# Patient Record
Sex: Male | Born: 2000 | Race: White | Hispanic: No | Marital: Single | Smoking: Never smoker
Health system: Southern US, Community
[De-identification: ages and names within clinical notes are randomized; demographics above are authoritative.]

## PROBLEM LIST (undated history)

## (undated) DIAGNOSIS — G809 Cerebral palsy, unspecified: Secondary | ICD-10-CM

## (undated) DIAGNOSIS — R6250 Unspecified lack of expected normal physiological development in childhood: Secondary | ICD-10-CM

## (undated) DIAGNOSIS — H509 Unspecified strabismus: Secondary | ICD-10-CM

## (undated) DIAGNOSIS — H539 Unspecified visual disturbance: Secondary | ICD-10-CM

## (undated) DIAGNOSIS — R569 Unspecified convulsions: Secondary | ICD-10-CM

## (undated) HISTORY — DX: Unspecified visual disturbance: H53.9

## (undated) HISTORY — PX: EYE SURGERY: SHX253

## (undated) HISTORY — DX: Cerebral palsy, unspecified: G80.9

## (undated) HISTORY — DX: Unspecified strabismus: H50.9

## (undated) HISTORY — PX: EYE MUSCLE SURGERY: SHX370

## (undated) HISTORY — DX: Unspecified lack of expected normal physiological development in childhood: R62.50

## (undated) HISTORY — DX: Unspecified convulsions: R56.9

---
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
## 2020-01-17 ENCOUNTER — Ambulatory Visit: Payer: Medicaid Other | Admitting: Pediatrics

## 2020-02-17 DIAGNOSIS — Z0279 Encounter for issue of other medical certificate: Secondary | ICD-10-CM

## 2020-06-12 ENCOUNTER — Telehealth: Payer: Self-pay | Admitting: Pediatrics

## 2020-06-12 NOTE — Telephone Encounter (Signed)
Medication forms on your desk to fill out please 

## 2020-06-15 NOTE — Telephone Encounter (Signed)
Medication form filled  

## 2020-06-28 ENCOUNTER — Other Ambulatory Visit: Payer: Self-pay | Admitting: Pediatrics

## 2020-08-30 ENCOUNTER — Other Ambulatory Visit: Payer: Self-pay | Admitting: Pediatrics

## 2020-10-03 ENCOUNTER — Telehealth: Payer: Self-pay

## 2020-10-03 NOTE — Telephone Encounter (Signed)
School form dropped off  Placed on desk

## 2020-10-09 ENCOUNTER — Telehealth: Payer: Self-pay

## 2020-10-09 NOTE — Telephone Encounter (Signed)
Called and left message to call back to make a [facility] suggested date was 11/23/20 at 10:45am as of 10/09/2020.  

## 2020-10-09 NOTE — Telephone Encounter (Signed)
Needs well visit--last WCC 12 months ago.

## 2020-10-09 NOTE — Telephone Encounter (Signed)
Called and left message to call back to make a [facility] suggested date was 11/23/20 at 10:45am as of 10/09/2020.

## 2020-10-10 ENCOUNTER — Telehealth: Payer: Self-pay

## 2020-10-10 NOTE — Telephone Encounter (Signed)
Open an error.

## 2020-10-16 ENCOUNTER — Ambulatory Visit: Payer: Medicaid Other | Admitting: Pediatrics

## 2020-10-17 ENCOUNTER — Telehealth: Payer: Self-pay

## 2020-10-17 NOTE — Telephone Encounter (Signed)
Called and asked to reschedule, missed last appointment she said they just had a big miscommunication. She was suppose to remind the person with [facility] that he had an appointment and so the time passes and as she realized and tried to gather the stuff she realized she could not make it.

## 2020-10-18 ENCOUNTER — Other Ambulatory Visit: Payer: Self-pay

## 2020-10-18 ENCOUNTER — Ambulatory Visit (INDEPENDENT_AMBULATORY_CARE_PROVIDER_SITE_OTHER): Payer: Medicaid Other | Admitting: Pediatrics

## 2020-10-18 VITALS — BP 118/70 | Ht 67.0 in | Wt 99.1 lb

## 2020-10-18 DIAGNOSIS — Z0000 Encounter for general adult medical examination without abnormal findings: Secondary | ICD-10-CM

## 2020-10-18 DIAGNOSIS — Q079 Congenital malformation of nervous system, unspecified: Secondary | ICD-10-CM

## 2020-10-18 DIAGNOSIS — Z0001 Encounter for general adult medical examination with abnormal findings: Secondary | ICD-10-CM | POA: Diagnosis not present

## 2020-10-18 DIAGNOSIS — G801 Spastic diplegic cerebral palsy: Secondary | ICD-10-CM

## 2020-10-18 DIAGNOSIS — G9349 Other encephalopathy: Secondary | ICD-10-CM

## 2020-10-18 DIAGNOSIS — Z68.41 Body mass index (BMI) pediatric, less than 5th percentile for age: Secondary | ICD-10-CM

## 2020-10-18 DIAGNOSIS — R6250 Unspecified lack of expected normal physiological development in childhood: Secondary | ICD-10-CM

## 2020-10-18 NOTE — Progress Notes (Signed)
Adolescent Well Care Visit Eric Irwin is a 19 y.o. male who is here for his annual check.    PCP:  Georgiann Hahn, MD   History was provided by the adoptive mother.  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 Cortical impairment  No neurology follow up anymore  Equipment Weighted spoon/fork--mom buys ---was not approved by insurance Mom makes bibs from towels  Speech--in speech therapy  PT/OT--trying to get him in for additional PT/OT at school   Nutrition: Good eater-chopped food--not minced--placed into 1/2 inch bite size. Full liquids.   Sleep:  Sleep: good  Social Screening: Lives with:  guardian Parental relations:  good---trip planned for vacation Activities, Work, and Chores: n/a Concerns regarding behavior with peers?  no Stressors of note: medical issues  Education: special ed   Tobacco?  no Secondhand smoke exposure?  no Drugs/ETOH?  no  Sexually Active?  no   Pregnancy Prevention: n/a  Safe at home, in school & in relationships?  Yes Safe to self?  Yes   Screenings: Patient has a dental home: yes  The patient completed the Rapid Assessment for Adolescent Preventive Services screening questionnaire and the following topics were identified as risk factors and discussed: speech/OT/PT ---special needs   PHQ-9 ---not applicable  Physical Exam:  Vitals:   10/18/20 1112  BP: 118/70  Weight: 99 lb 0.9 oz (44.9 kg)  Height: 5\' 7"  (1.702 m)   BP 118/70   Ht 5\' 7"  (1.702 m)   Wt 99  lb 0.9 oz (44.9 kg)   BMI 15.51 kg/m  Body mass index: body mass index is 15.51 kg/m. Blood pressure percentiles are not available for patients who are 18 years or older.    General Appearance:   well nourished  HENT: Normocephalic, no obvious abnormality, conjunctiva clear  Mouth:   Normal appearing teeth, no obvious discoloration, dental caries, or dental caps  Neck:   Supple; thyroid: no enlargement, symmetric, no tenderness/mass/nodules     Lungs:   Clear to auscultation bilaterally, normal work of breathing  Heart:   Regular rate and rhythm, S1 and S2 normal, no murmurs;   Abdomen:   Soft, non-tender, no mass, or organomegaly  GU normal male genitals, no testicular masses or hernia  Musculoskeletal:   Tone and strength strong and symmetrical, all extremities               Lymphatic:   No cervical adenopathy  Skin/Hair/Nails:   Skin warm, dry and intact, no rashes, no bruises or petechiae  Neurologic:   Strength, gait----normal     Assessment and Plan:   Stable-Spastic CP/ Dev Delay/ Static encephalopathy  BMI is appropriate for age  Vaccines reviewed and up to date  Follow up in 6 months  , MD

## 2020-10-19 ENCOUNTER — Encounter: Payer: Self-pay | Admitting: Pediatrics

## 2020-10-19 DIAGNOSIS — Z0000 Encounter for general adult medical examination without abnormal findings: Secondary | ICD-10-CM | POA: Insufficient documentation

## 2020-10-19 DIAGNOSIS — Q079 Congenital malformation of nervous system, unspecified: Secondary | ICD-10-CM

## 2020-10-19 HISTORY — DX: Congenital malformation of nervous system, unspecified: Q07.9

## 2020-10-19 NOTE — Patient Instructions (Signed)
Dysphagia Eating Plan, Bite Size Food This diet plan is for people with moderate swallowing problems who have transitioned from pureed and minced foods. Bite size foods are soft and cut into small chunks so that they can be swallowed safely. On this eating plan, you may be instructed to drink liquids that are thickened. Work with your health care provider and your diet and nutrition specialist (dietitian) to make sure that you are following the diet safely and getting all the nutrients you need. What are tips for following this plan? General guidelines for foods   You may eat foods that are tender, soft, and moist.  Always test food texture before taking a bite. Poke food with a fork or spoon to make sure it is tender.  Food should be easy to cut and shew. Avoid large pieces of food that require a lot of chewing.  Take small bites. Each bite should be smaller than your thumb nail (about 15mm by 15 mm).  If you were on pureed and minced food diet plans, you may eat any of the foods included in those diets.  Avoid foods that are very dry, hard, sticky, chewy, coarse, or crunchy.  If instructed by your health care provider, thicken liquids. Follow your health care provider's instructions for what products to use, how to do this, and to what thickness. ? Your health care provider may recommend using a commercial thickener, rice cereal, or potato flakes. Ask your health care provider to recommend thickeners. ? Thickened liquids are usually a "pudding-like" consistency, or they may be as thick as honey or thick enough to eat with a spoon. Cooking  To moisten foods, you may add liquids while you are blending, mashing, or grinding your foods to the right consistency. These liquids include gravies, sauces, vegetable or fruit juice, milk, half and half, or water.  Strain extra liquid from foods before eating.  Reheat foods slowly to prevent a tough crust from forming.  Prepare foods in advance.  Meal planning  Eat a variety of foods to get all the nutrients you need.  Some foods may be tolerated better than others. Work with your dietitian to identify which foods are safest for you to eat.  Follow your meal plan as told by your dietitian. What foods are allowed? Grains Moist breads without nuts or seeds. Biscuits, muffins, pancakes, and waffles that are well-moistened with syrup, jelly, margarine, or butter. Cooked cereals. Moist bread stuffing. Moist rice. Well-moistened cold cereal with small chunks. Well-cooked pasta, noodles, rice, and bread dressing in small pieces and thick sauce. Soft dumplings or spaetzle in small pieces and butter or gravy. Vegetables Soft, well-cooked vegetables in small pieces. Soft-cooked, mashed potatoes. Thickened vegetable juice. Fruits Canned or cooked fruits that are soft or moist and do not have skin or seeds. Fresh, soft bananas. Thickened fruit juices. Meat and other protein foods Tender, moist meats or poultry in small pieces. Moist meatballs or meatloaf. Fish without bones. Eggs or egg substitutes in small pieces. Tofu. Tempeh and meat alternatives in small pieces. Well-cooked, tender beans, peas, baked beans, and other legumes. Dairy Thickened milk. Cream cheese. Yogurt. Cottage cheese. Sour cream. Small pieces of soft cheese. Fats and oils Butter. Oils. Margarine. Mayonnaise. Gravy. Spreads. Sweets and desserts Soft, smooth, moist desserts. Pudding. Custard. Moist cakes. Jam. Jelly. Honey. Preserves. Ask your health care provider whether you can have frozen desserts. Seasoning and other foods All seasonings and sweeteners. All sauces with small chunks. Prepared tuna, egg, or chicken   salad without raw fruits or vegetables. Moist casseroles with small, tender pieces of meat. Soups with tender meat. What foods are not allowed? Grains Coarse or dry cereals. Dry breads. Toast. Crackers. Tough, crusty breads, such as French bread and baguettes.  Dry pancakes, waffles, and muffins. Sticky rice. Dry bread stuffing. Granola. Popcorn. Chips. Vegetables All raw vegetables. Cooked corn. Rubbery or stiff cooked vegetables. Stringy vegetables, such as celery. Tough, crisp fried potatoes. Potato skins. Fruits Hard, crunchy, stringy, high-pulp, and juicy raw fruits such as apples, pineapple, papaya, and watermelon. Small, round fruits, such as grapes. Dried fruit and fruit leather. Meat and other protein foods Large pieces of meat. Dry, tough meats, such as bacon, sausage, and hot dogs. Chicken, turkey, or fish with skin and bones. Crunchy peanut butter. Nuts. Seeds. Nut and seed butters. Dairy Yogurt with nuts, seeds, or large chunks. Large chunks of cheese. Frozen desserts and milk consistency not allowed by your dietitian. Sweets and desserts Dry cakes. Chewy or dry cookies. Any desserts with nuts, seeds, dry fruits, coconut, pineapple, or anything dry, sticky, or hard. Chewy caramel. Licorice. Taffy-type candies. Ask your health care provider whether you can have frozen desserts. Seasoning and other foods Soups with tough or large chunks of meats, poultry, or vegetables. Corn or clam chowder. Smoothies with large chunks of fruit. Summary  Bite size foods can be helpful for people with moderate swallowing problems.  On the dysphagia eating plan, you may eat foods that are soft, moist, and cut into pieces smaller than 15mm by 15mm.  You may be instructed to thicken liquids. Follow your health care provider's instructions about how to do this and to what consistency. This information is not intended to replace advice given to you by your health care provider. Make sure you discuss any questions you have with your health care provider. Document Revised: 02/04/2019 Document Reviewed: 01/24/2017 Elsevier Patient Education  2020 [employer] Inc.  

## 2020-11-13 ENCOUNTER — Ambulatory Visit: Payer: Medicaid Other | Admitting: Pediatrics

## 2020-12-09 ENCOUNTER — Other Ambulatory Visit: Payer: Self-pay | Admitting: Pediatrics

## 2021-04-02 ENCOUNTER — Encounter (HOSPITAL_BASED_OUTPATIENT_CLINIC_OR_DEPARTMENT_OTHER): Payer: Self-pay | Admitting: Pediatric Dentistry

## 2021-04-02 ENCOUNTER — Other Ambulatory Visit: Payer: Self-pay

## 2021-04-02 NOTE — Progress Notes (Signed)
Reviewed pt's pmh with Dr Malen Gauze. Ok to proceed with surgery at [facility].

## 2021-04-03 ENCOUNTER — Ambulatory Visit (INDEPENDENT_AMBULATORY_CARE_PROVIDER_SITE_OTHER): Payer: Medicaid Other | Admitting: Pediatrics

## 2021-04-03 VITALS — BP 112/80 | HR 121 | Ht 65.5 in | Wt 95.3 lb

## 2021-04-03 DIAGNOSIS — Z01818 Encounter for other preprocedural examination: Secondary | ICD-10-CM

## 2021-04-03 DIAGNOSIS — G9349 Other encephalopathy: Secondary | ICD-10-CM | POA: Diagnosis not present

## 2021-04-03 DIAGNOSIS — G801 Spastic diplegic cerebral palsy: Secondary | ICD-10-CM | POA: Diagnosis not present

## 2021-04-03 DIAGNOSIS — Q079 Congenital malformation of nervous system, unspecified: Secondary | ICD-10-CM | POA: Diagnosis not present

## 2021-04-04 ENCOUNTER — Encounter: Payer: Self-pay | Admitting: Pediatrics

## 2021-04-04 DIAGNOSIS — Z01818 Encounter for other preprocedural examination: Secondary | ICD-10-CM | POA: Insufficient documentation

## 2021-04-04 NOTE — Patient Instructions (Signed)
General Anesthesia, Adult General anesthesia is the use of medicines to make a person "go to sleep" (unconscious) for a medical procedure. General anesthesia must be used for certain procedures, and is often recommended for procedures that:  Last a long time.  Require you to be still or in an unusual position.  Are major and can cause blood loss. The medicines used for general anesthesia are called general anesthetics. As well as making you unconscious for a certain amount of time, these medicines:  Prevent pain.  Control your blood pressure.  Relax your muscles. Tell a health care provider about:  Any allergies you have.  All medicines you are taking, including vitamins, herbs, eye drops, creams, and over-the-counter medicines.  Any problems you or family members have had with anesthetic medicines.  Types of anesthetics you have had in the past.  Any blood disorders you have.  Any surgeries you have had.  Any medical conditions you have.  Any recent upper respiratory, chest, or ear infections.  Any history of: ? Heart or lung conditions, such as heart failure, sleep apnea, asthma, or chronic obstructive pulmonary disease (COPD). ? Military service. ? Depression or anxiety.  Any tobacco or drug use, including marijuana or alcohol use.  Whether you are pregnant or may be pregnant. What are the risks? Generally, this is a safe procedure. However, problems may occur, including:  Allergic reaction.  Lung and heart problems.  Inhaling food or liquid from the stomach into the lungs (aspiration).  Nerve injury.  Dental injury.  Air in the bloodstream, which can lead to stroke.  Extreme agitation or confusion (delirium) when you wake up from the anesthetic.  Waking up during your procedure and being unable to move. This is rare. These problems are more likely to develop if you are having a major surgery or if you have an advanced or serious medical condition. You  can prevent some of these complications by answering all of your health care provider's questions thoroughly and by following all instructions before your procedure. General anesthesia can cause side effects, including:  Nausea or vomiting.  A sore throat from the breathing tube.  Hoarseness.  Wheezing or coughing.  Shaking chills.  Tiredness.  Body aches.  Anxiety.  Sleepiness or drowsiness.  Confusion or agitation. What happens before the procedure? Staying hydrated Follow instructions from your health care provider about hydration, which may include:  Up to 2 hours before the procedure - you may continue to drink clear liquids, such as water, clear fruit juice, black coffee, and plain tea.   Eating and drinking restrictions Follow instructions from your health care provider about eating and drinking, which may include:  8 hours before the procedure - stop eating heavy meals or foods such as meat, fried foods, or fatty foods.  6 hours before the procedure - stop eating light meals or foods, such as toast or cereal.  6 hours before the procedure - stop drinking milk or drinks that contain milk.  2 hours before the procedure - stop drinking clear liquids. Medicines Ask your health care provider about:  Changing or stopping your regular medicines. This is especially important if you are taking diabetes medicines or blood thinners.  Taking medicines such as aspirin and ibuprofen. These medicines can thin your blood. Do not take these medicines unless your health care provider tells you to take them.  Taking over-the-counter medicines, vitamins, herbs, and supplements. Do not take these during the week before your procedure unless your health   care provider approves them. General instructions  Starting 3-6 weeks before the procedure, do not use any products that contain nicotine or tobacco, such as cigarettes and e-cigarettes. If you need help quitting, ask your health care  provider.  If you brush your teeth on the morning of the procedure, make sure to spit out all of the toothpaste.  Tell your health care provider if you become ill or develop a cold, cough, or fever.  If instructed by your health care provider, bring your sleep apnea device with you on the day of your surgery (if applicable).  Ask your health care provider if you will be going home the same day, the following day, or after a longer hospital stay. ? Plan to have a responsible adult take you home from the hospital or clinic. ? Plan to have a responsible adult care for you for the time you are told after you leave the hospital or clinic. This is important. What happens during the procedure?  You will be given anesthetics through both of the following: ? A mask placed over your nose and mouth. ? An IV in one of your veins.  You may receive a medicine to help you relax (sedative).  After you are unconscious, a breathing tube may be inserted down your throat to help you breathe. This will be removed before you wake up.  An anesthesia specialist will stay with you throughout your procedure. He or she will: ? Keep you comfortable and safe by continuing to give you medicines and adjusting the amount of medicine that you get. ? Monitor your blood pressure, pulse, and oxygen levels to make sure that the anesthetics do not cause any problems. The procedure may vary among health care providers and hospitals.   What happens after the procedure?  Your blood pressure, temperature, heart rate, breathing rate, and blood oxygen level will be monitored until the medicines you were given have worn off.  You will wake up in a recovery area. You may wake up slowly.  If you feel anxious or agitated, you may be given medicine to help you calm down.  If you will be going home the same day, your health care provider may check to make sure you can walk, drink, and urinate.  Your health care provider will treat  any pain or side effects you have before you go home.  Do not drive or operate machinery until your health care provider says that it is safe. Summary  General anesthesia is used to keep you still and prevent pain during a procedure.  It is important to tell your health care provider about your medical history and any surgeries you have had, and previous experience with anesthesia.  Follow your health care provider's instructions about when to stop eating, drinking, or taking certain medicines before your procedure.  Plan to have a responsible adult take you home from the hospital or clinic. This information is not intended to replace advice given to you by your health care provider. Make sure you discuss any questions you have with your health care provider. Document Revised: 06/26/2020 Document Reviewed: 01/26/2020 Elsevier Patient Education  2021 Elsevier Inc.  

## 2021-04-04 NOTE — Progress Notes (Signed)
Subjective:    Eric Irwin is a 20 y.o. male with STATIC ENCEPHALOPATHY AND DEVELOPMENTAL DELAY who presents to the office today for a preoperative consultation at the request of surgeon --dentist who plans on performing full mouth rehab on June 18. This consultation is requested for the specific conditions prompting preoperative evaluation (i.e. because of potential affect on operative risk): Routine . Planned anesthesia: general. The patient has the following known anesthesia issues: none. Patients bleeding risk: no recent abnormal bleeding. Patient does not have objections to receiving blood products if needed.  The following portions of the patient's history were reviewed and updated as appropriate: allergies, current medications, past family history, past medical history, past social history, past surgical history and problem list.  Review of Systems Pertinent items are noted in HPI.    Objective:    BP 112/80   Pulse (!) 121   Ht 5' 5.5" (1.664 m)   Wt 95 lb 4.8 oz (43.2 kg)   SpO2 95%   BMI 15.62 kg/m  General appearance: alert, cooperative and no distress Head: Normocephalic, without obvious abnormality Eyes: negative Ears: normal TM's and external ear canals both ears Nose: Nares normal. Septum midline. Mucosa normal. No drainage or sinus tenderness. Throat: abnormal findings: gingivitis and multiple caries Neck: no adenopathy and supple, symmetrical, trachea midline Lungs: clear to auscultation bilaterally Heart: regular rate and rhythm, S1, S2 normal, no murmur, click, rub or gallop Abdomen: soft, non-tender; bowel sounds normal; no masses,  no organomegaly   Skin: normal Neurologic: baseline mental status---non communicative.  Predictors of intubation difficulty: No anticipated risk for anesthesia   Assessment:      20 y.o. male with planned surgery as above.   Known risk factors for perioperative complications: None   Difficulty with intubation is not  anticipated.  Cardiac Risk Estimation: none    Plan:    1. Preoperative exam normal 2. Cleared for surgery under GA 3. Follow as needed

## 2021-04-10 ENCOUNTER — Encounter (HOSPITAL_BASED_OUTPATIENT_CLINIC_OR_DEPARTMENT_OTHER)
Admission: RE | Disposition: A | Discharge status: Home / Self Care | Payer: Self-pay | Source: Home / Self Care | Attending: Pediatric Dentistry

## 2021-04-10 ENCOUNTER — Ambulatory Visit (HOSPITAL_BASED_OUTPATIENT_CLINIC_OR_DEPARTMENT_OTHER): Payer: Medicaid Other | Admitting: Certified Registered"

## 2021-04-10 ENCOUNTER — Encounter (HOSPITAL_BASED_OUTPATIENT_CLINIC_OR_DEPARTMENT_OTHER): Payer: Self-pay | Admitting: Pediatric Dentistry

## 2021-04-10 ENCOUNTER — Other Ambulatory Visit: Payer: Self-pay

## 2021-04-10 ENCOUNTER — Ambulatory Visit (HOSPITAL_BASED_OUTPATIENT_CLINIC_OR_DEPARTMENT_OTHER)
Admission: RE | Admit: 2021-04-10 | Discharge: 2021-04-10 | Disposition: A | Discharge status: Home / Self Care | Payer: Medicaid Other | Attending: Pediatric Dentistry | Admitting: Pediatric Dentistry

## 2021-04-10 DIAGNOSIS — K029 Dental caries, unspecified: Secondary | ICD-10-CM | POA: Insufficient documentation

## 2021-04-10 DIAGNOSIS — F88 Other disorders of psychological development: Secondary | ICD-10-CM | POA: Diagnosis not present

## 2021-04-10 HISTORY — PX: DENTAL RESTORATION/EXTRACTION WITH X-RAY: SHX5796

## 2021-04-10 SURGERY — DENTAL RESTORATION/EXTRACTION WITH X-RAY
Anesthesia: General | Site: Mouth

## 2021-04-10 MED ORDER — ACETAMINOPHEN 10 MG/ML IV SOLN: 1000.0000 mg | Freq: Once | INTRAVENOUS | Status: DC | PRN

## 2021-04-10 MED ORDER — OXYMETAZOLINE HCL 0.05 % NA SOLN
NASAL | Status: DC | PRN
  Administered 2021-04-10: 2 via NASAL

## 2021-04-10 MED ORDER — SUGAMMADEX SODIUM 200 MG/2ML IV SOLN
INTRAVENOUS | Status: DC | PRN
  Administered 2021-04-10: 100 mg via INTRAVENOUS

## 2021-04-10 MED ORDER — DEXAMETHASONE SODIUM PHOSPHATE 10 MG/ML IJ SOLN
INTRAMUSCULAR | Status: DC | PRN
  Administered 2021-04-10: 6 mg via INTRAVENOUS

## 2021-04-10 MED ORDER — MIDAZOLAM HCL 2 MG/2ML IJ SOLN
INTRAMUSCULAR | Status: AC
  Filled 2021-04-10: qty 2

## 2021-04-10 MED ORDER — DEXAMETHASONE SODIUM PHOSPHATE 10 MG/ML IJ SOLN
INTRAMUSCULAR | Status: AC
  Filled 2021-04-10: qty 1

## 2021-04-10 MED ORDER — LIDOCAINE-EPINEPHRINE 2 %-1:100000 IJ SOLN
INTRAMUSCULAR | Status: DC | PRN
  Administered 2021-04-10: 1 mL

## 2021-04-10 MED ORDER — PROMETHAZINE HCL 25 MG/ML IJ SOLN: 6.2500 mg | INTRAMUSCULAR | Status: DC | PRN

## 2021-04-10 MED ORDER — LIDOCAINE HCL (CARDIAC) PF 100 MG/5ML IV SOSY
PREFILLED_SYRINGE | INTRAVENOUS | Status: DC | PRN
  Administered 2021-04-10: 60 mg via INTRAVENOUS

## 2021-04-10 MED ORDER — MIDAZOLAM HCL 2 MG/ML PO SYRP
15.0000 mg | ORAL_SOLUTION | Freq: Once | ORAL | Status: AC
  Administered 2021-04-10: 15 mg via ORAL

## 2021-04-10 MED ORDER — ROCURONIUM 10MG/ML (10ML) SYRINGE FOR MEDFUSION PUMP - OPTIME: INTRAVENOUS | Status: DC | PRN

## 2021-04-10 MED ORDER — KETOROLAC TROMETHAMINE 30 MG/ML IJ SOLN
INTRAMUSCULAR | Status: DC | PRN
  Administered 2021-04-10: 15 mg via INTRAVENOUS

## 2021-04-10 MED ORDER — EPHEDRINE 5 MG/ML INJ
INTRAVENOUS | Status: AC
  Filled 2021-04-10: qty 30

## 2021-04-10 MED ORDER — FENTANYL CITRATE (PF) 100 MCG/2ML IJ SOLN
INTRAMUSCULAR | Status: AC
  Filled 2021-04-10: qty 2

## 2021-04-10 MED ORDER — ONDANSETRON HCL 4 MG/2ML IJ SOLN
INTRAMUSCULAR | Status: AC
  Filled 2021-04-10: qty 2

## 2021-04-10 MED ORDER — ONDANSETRON HCL 4 MG/2ML IJ SOLN
INTRAMUSCULAR | Status: DC | PRN
  Administered 2021-04-10: 4 mg via INTRAVENOUS

## 2021-04-10 MED ORDER — ROCURONIUM BROMIDE 100 MG/10ML IV SOLN
INTRAVENOUS | Status: DC | PRN
  Administered 2021-04-10: 40 mg via INTRAVENOUS

## 2021-04-10 MED ORDER — FENTANYL CITRATE (PF) 100 MCG/2ML IJ SOLN
INTRAMUSCULAR | Status: DC | PRN
  Administered 2021-04-10: 100 ug via INTRAVENOUS

## 2021-04-10 MED ORDER — FENTANYL CITRATE (PF) 100 MCG/2ML IJ SOLN: 25.0000 ug | INTRAMUSCULAR | Status: DC | PRN

## 2021-04-10 MED ORDER — KETOROLAC TROMETHAMINE 30 MG/ML IJ SOLN
INTRAMUSCULAR | Status: AC
  Filled 2021-04-10: qty 1

## 2021-04-10 MED ORDER — PROPOFOL 10 MG/ML IV BOLUS
INTRAVENOUS | Status: DC | PRN
  Administered 2021-04-10: 120 mg via INTRAVENOUS

## 2021-04-10 MED ORDER — MIDAZOLAM HCL 2 MG/ML PO SYRP
ORAL_SOLUTION | ORAL | Status: AC
  Filled 2021-04-10: qty 10

## 2021-04-10 MED ORDER — LACTATED RINGERS IV SOLN: INTRAVENOUS | Status: DC

## 2021-04-10 MED ORDER — DEXMEDETOMIDINE (PRECEDEX) IN NS 20 MCG/5ML (4 MCG/ML) IV SYRINGE
PREFILLED_SYRINGE | INTRAVENOUS | Status: AC
  Filled 2021-04-10: qty 5

## 2021-04-10 MED ORDER — DEXMEDETOMIDINE (PRECEDEX) IN NS 20 MCG/5ML (4 MCG/ML) IV SYRINGE
PREFILLED_SYRINGE | INTRAVENOUS | Status: DC | PRN
  Administered 2021-04-10 (×2): 4 ug via INTRAVENOUS

## 2021-04-10 MED ORDER — PROPOFOL 10 MG/ML IV BOLUS
INTRAVENOUS | Status: AC
  Filled 2021-04-10: qty 20

## 2021-04-10 MED ORDER — ROCURONIUM BROMIDE 10 MG/ML (PF) SYRINGE
PREFILLED_SYRINGE | INTRAVENOUS | Status: AC
  Filled 2021-04-10: qty 10

## 2021-04-10 MED ORDER — BUPIVACAINE HCL (PF) 0.25 % IJ SOLN
INTRAMUSCULAR | Status: AC
  Filled 2021-04-10: qty 210

## 2021-04-10 SURGICAL SUPPLY — 18 items
BLADE SURG 15 STRL LF DISP TIS (BLADE) ×1 IMPLANT
BLADE SURG 15 STRL SS (BLADE) ×3
BNDG COHESIVE 2X5 TAN STRL LF (GAUZE/BANDAGES/DRESSINGS) IMPLANT
BNDG CONFORM 2 STRL LF (GAUZE/BANDAGES/DRESSINGS) ×3 IMPLANT
BNDG EYE OVAL (GAUZE/BANDAGES/DRESSINGS) ×6 IMPLANT
COVER MAYO STAND STRL (DRAPES) ×3 IMPLANT
COVER SURGICAL LIGHT HANDLE (MISCELLANEOUS) ×3 IMPLANT
MANIFOLD NEPTUNE II (INSTRUMENTS) ×3 IMPLANT
SLEEVE SCD COMPRESS KNEE MED (STOCKING) ×2 IMPLANT
SOLUTION BUTLER CLEAR DIP (MISCELLANEOUS) ×3 IMPLANT
SUCTION FRAZIER HANDLE 10FR (MISCELLANEOUS)
SUCTION TUBE FRAZIER 10FR DISP (MISCELLANEOUS) IMPLANT
TOWEL GREEN STERILE FF (TOWEL DISPOSABLE) ×3 IMPLANT
TRAY DSU PREP LF (CUSTOM PROCEDURE TRAY) ×3 IMPLANT
TUBE CONNECTING 20X1/4 (TUBING) ×3 IMPLANT
WATER STERILE IRR 1000ML POUR (IV SOLUTION) ×3 IMPLANT
WATER TABLETS ICX (MISCELLANEOUS) ×3 IMPLANT
YANKAUER SUCT BULB TIP NO VENT (SUCTIONS) IMPLANT

## 2021-04-10 NOTE — Anesthesia Procedure Notes (Signed)
Procedure Name: Intubation Date/Time: 04/10/2021 12:21 PM Performed by: Ezequiel Kayser, CRNA Pre-anesthesia Checklist: Patient identified, Emergency Drugs available, Suction available and Patient being monitored Patient Re-evaluated:Patient Re-evaluated prior to induction Oxygen Delivery Method: Circle System Utilized Preoxygenation: Pre-oxygenation with 100% oxygen Induction Type: IV induction Ventilation: Mask ventilation without difficulty Laryngoscope Size: Mac and 4 Grade View: Grade I Nasal Tubes: Nasal Rae, Nasal prep performed and Left Tube size: 7.0 mm Number of attempts: 1 Airway Equipment and Method: Stylet and Oral airway Placement Confirmation: ETT inserted through vocal cords under direct vision, positive ETCO2 and breath sounds checked- equal and bilateral Secured at: 16 cm Tube secured with: Tape Dental Injury: Teeth and Oropharynx as per pre-operative assessment

## 2021-04-10 NOTE — Anesthesia Preprocedure Evaluation (Addendum)
Anesthesia Evaluation  Patient identified by MRN, date of birth, ID band Patient awake    Reviewed: Allergy & Precautions, NPO status , Patient's Chart, lab work & pertinent test results  Airway Mallampati: II   Neck ROM: Full  Mouth opening: Limited Mouth Opening  Dental  (+) Teeth Intact   Pulmonary neg pulmonary ROS,    Pulmonary exam normal        Cardiovascular negative cardio ROS   Rhythm:Regular Rate:Normal     Neuro/Psych Seizures - (neonatal, none since age 24), Well Controlled,  Cerebral palsy  negative psych ROS   GI/Hepatic negative GI ROS, Neg liver ROS,   Endo/Other  negative endocrine ROS  Renal/GU negative Renal ROS  negative genitourinary   Musculoskeletal negative musculoskeletal ROS (+)   Abdominal   Peds  (+) Neurological problem Hematology negative hematology ROS (+)   Anesthesia Other Findings Dental caries  Reproductive/Obstetrics                            Anesthesia Physical Anesthesia Plan  ASA: 3  Anesthesia Plan: General   Post-op Pain Management:    Induction: Intravenous  PONV Risk Score and Plan: 2 and Ondansetron, Dexamethasone, Midazolam and Treatment may vary due to age or medical condition  Airway Management Planned: Mask and Nasal ETT  Additional Equipment: None  Intra-op Plan:   Post-operative Plan: Extubation in OR  Informed Consent: I have reviewed the patients History and Physical, chart, labs and discussed the procedure including the risks, benefits and alternatives for the proposed anesthesia with the patient or authorized representative who has indicated his/her understanding and acceptance.     Dental advisory given and Consent reviewed with POA  Plan Discussed with:   Anesthesia Plan Comments:        Anesthesia Quick Evaluation

## 2021-04-10 NOTE — H&P (Signed)
No change in medical history.  

## 2021-04-10 NOTE — Brief Op Note (Signed)
Full mouth rehabilitation completed without complication.  Op note will be dictated.  Patient will follow up in office in 6 months.

## 2021-04-10 NOTE — Discharge Instructions (Signed)

## 2021-04-10 NOTE — Anesthesia Postprocedure Evaluation (Signed)
Anesthesia Post Note  Patient: Eric Irwin  Procedure(s) Performed: DENTAL RESTORATION/EXTRACTION WITH X-RAY (Mouth)     Patient location during evaluation: PACU Anesthesia Type: General Level of consciousness: awake and alert Pain management: pain level controlled Vital Signs Assessment: post-procedure vital signs reviewed and stable Respiratory status: spontaneous breathing, nonlabored ventilation and respiratory function stable Cardiovascular status: blood pressure returned to baseline and stable Postop Assessment: no apparent nausea or vomiting Anesthetic complications: no   No notable events documented.  Last Vitals:  Vitals:   04/10/21 1445 04/10/21 1517  BP: 130/81 (!) 124/94  Pulse: 86 85  Resp: 11 12  Temp:  36.7 C  SpO2: 94% 96%    Last Pain:  Vitals:   04/10/21 1121  TempSrc: Axillary  PainSc: 0-No pain                 Lowella Curb

## 2021-04-10 NOTE — Transfer of Care (Signed)
Immediate Anesthesia Transfer of Care Note  Patient: Eric Irwin  Procedure(s) Performed: DENTAL RESTORATION/EXTRACTION WITH X-RAY (Mouth)  Patient Location: PACU  Anesthesia Type:General  Level of Consciousness: drowsy  Airway & Oxygen Therapy: Patient Spontanous Breathing and Patient connected to face mask oxygen  Post-op Assessment: Report given to RN and Post -op Vital signs reviewed and stable  Post vital signs: Reviewed and stable  Last Vitals:  Vitals Value Taken Time  BP 128/79 04/10/21 1421  Temp    Pulse 80 04/10/21 1424  Resp 12 04/10/21 1424  SpO2 95 % 04/10/21 1424  Vitals shown include unvalidated device data.  Last Pain:  Vitals:   04/10/21 1121  TempSrc: Axillary  PainSc: 0-No pain      Patients Stated Pain Goal: 3 (04/10/21 1121)  Complications: No notable events documented.

## 2021-04-11 ENCOUNTER — Encounter (HOSPITAL_BASED_OUTPATIENT_CLINIC_OR_DEPARTMENT_OTHER): Payer: Self-pay | Admitting: Pediatric Dentistry

## 2021-04-11 NOTE — Op Note (Signed)
Eric Irwin, Eric Irwin MEDICAL RECORD NO: [id number] ACCOUNT NO: [id number] DATE OF BIRTH: 2000/12/12 FACILITY: [facility] LOCATION: [facility] PHYSICIAN: Cleotilde Neer. Jeanella Craze, DDS  Operative Report   DATE OF PROCEDURE: 04/10/2021  SURGEON:  Cleotilde Neer. Jeanella Craze, DDS  PREOPERATIVE DIAGNOSES:  Severe developmental delays, multiple carious teeth.  POSTOPERATIVE DIAGNOSES:  Severe developmental delays, multiple carious teeth.  PROCEDURE PERFORMED:  Full mouth dental rehabilitation.  ANESTHESIA:  General.  DESCRIPTION OF PROCEDURE:  An IV was started in preop and the patient was transported to OR room #6 at [facility].  General anesthesia was induced with propofol and direct nasal endotracheal intubation was established.  A throat pack  was placed and four intraoral radiographs were obtained.  All teeth were cleaned with a Cavitron to remove heavy calculus.  Tooth numbers 2, 15, 18, 30 and 31 received composite resin restorations. Less than 0.5 mL of 2% lidocaine with 1:100,000  epinephrine was used to anesthetize the operculum distal to tooth #18.  The operculum was removed with a 15 blade.  All teeth were cleaned with dental pumice toothpaste, and topical fluoride was placed.  The mouth was thoroughly cleansed and the throat  pack was removed.  The patient was undraped and extubated in the operating room and transported to the PACU in stable condition.  The patient will follow up in office in 6 months.   SHW D: 04/11/2021 3:45:34 pm T: 04/11/2021 10:40:00 pm  JOB: [id number]/ [id number]

## 2021-05-01 ENCOUNTER — Ambulatory Visit (INDEPENDENT_AMBULATORY_CARE_PROVIDER_SITE_OTHER): Payer: Medicaid Other | Admitting: Pediatrics

## 2021-05-01 ENCOUNTER — Other Ambulatory Visit: Payer: Self-pay

## 2021-05-01 VITALS — Wt 98.7 lb

## 2021-05-01 DIAGNOSIS — R040 Epistaxis: Secondary | ICD-10-CM | POA: Diagnosis not present

## 2021-05-01 NOTE — Patient Instructions (Signed)
Nosebleed, Adult A nosebleed is when blood comes out of the nose. Nosebleeds are common and can be caused by many things. They are usually not a sign of a serious medical problem. Follow these instructions at home: When you have a nosebleed:  Sit down. Tilt your head a little forward. Follow these steps: Pinch your nose with a clean towel or tissue. Keep pinching your nose for 5 minutes. Do not let go. After 5 minutes, let go of your nose. If there is still bleeding, do these steps again. Keep doing these steps until the bleeding stops. Do not put tissues or other things in your nose to stop the bleeding. Avoid lying down or putting your head back. Use a nose spray decongestant as told by your doctor. After a nosebleed: Try not to blow your nose or sniffle for several hours. Try not to strain, lift, or bend at the waist for several days. Aspirin and blood-thinning medicines make bleeding more likely. If you take these medicines: Ask your doctor if you should stop taking them or if you should change how much you take. Do not stop taking the medicine unless your doctor tells you to. If your nosebleed was caused by dryness, use over-the-counter saline nasal spray or gel and humidifier as told by your doctor. This will keep the inside of your nose moist and allow it to heal. If you need to use one of these products: Choose one that is water-soluble. Use only as much as you need and use it only as often as needed. Do not lie down right away after you use it. If you get nosebleeds often, talk with your doctor about treatments. These may include: Nasal cautery. A chemical swab or electrical device is used to lightly burn tiny blood vessels inside the nose. This helps stop or prevent nosebleeds. Nasal packing. A gauze or other material is placed in the nose to keep constant pressure on the bleeding area. Contact a doctor if: You have a fever. You get nosebleeds often. You are getting  nosebleeds more often than usual. You bruise very easily. You have something stuck in your nose. You have bleeding in your mouth. You vomit or cough up brown material. You get a nosebleed after you start a new medicine. Get help right away if: You have a nosebleed after you fall or hurt your head. Your nosebleed does not go away after 20 minutes. You feel dizzy or weak. You have unusual bleeding from other parts of your body. You have unusual bruising on other parts of your body. You get sweaty. You vomit blood. Summary Nosebleeds are common. They are usually not a sign of a serious medical problem. When you have a nosebleed, sit down and tilt your head a little forward. Pinch your nose with a clean tissue for 5 minutes. Use saline spray or saline gel and a humidifier as told by your doctor. Get help right away if your nosebleed does not go away after 20 minutes. This information is not intended to replace advice given to you by your health care provider. Make sure you discuss any questions you have with your health care provider. Document Revised: 08/12/2019 Document Reviewed: 08/12/2019 Elsevier Patient Education  2022 Elsevier Inc.  

## 2021-05-01 NOTE — Progress Notes (Signed)
  Subjective:    Eric Irwin is a 20 y.o. 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  Mom reports that a couple weeks ago with x3 bloody nose that were difficult to stop.  Lasted 30-40 min.  Since then he has not had any but does usually average 1-2x/week.  Mom feels that they are are all year long but happen more in winter.  He does have some seasonal allergies.  Mom also feels that he touches the nose often and seems more of a thing that he just does.    The following portions of the patient's history were reviewed and updated as appropriate: allergies, current medications, past family history, past medical history, past social history, past surgical history and problem list.  Review of Systems Pertinent items are noted in HPI.   Allergies: No Known Allergies   Current Outpatient Medications on File Prior to Visit  Medication Sig Dispense Refill   cetirizine (ZYRTEC) 10 MG tablet TAKE 1 TABLET(10 MG) BY MOUTH DAILY 30 tablet 12   diazepam (VALIUM) 5 MG tablet   0   glycopyrrolate (ROBINUL) 1 MG tablet Take 1 tablet (1 mg total) by mouth 3 (three) times daily. 270 tablet 4   No current facility-administered medications on file prior to visit.    History and Problem List: Past Medical History:  Diagnosis Date   Cerebral palsy (HCC) 10/05/2012   Development delay    Seizures (HCC)    neonatal, no seizure since 3d old   Strabismus    eye muscle surgery   Vision abnormalities    cortical visual deficit        Objective:    Wt 98 lb 11.2 oz (44.8 kg)   BMI 16.17 kg/m   General: alert, active, cooperative, non toxic, wheel chair bound ENT: oropharynx moist, no lesions, nares irritated, no bleeding Lungs: clear to auscultation, no wheeze, crackles or retractions Heart: RRR, Nl S1, S2, no murmurs Abd: soft, non tender, non distended, normal BS, no organomegaly, no masses appreciated Skin: no rashes Neuro:  normal mental status, No focal deficits  No results found for this or any previous visit (from the past 72 hour(s)).     Assessment:   Eric Irwin is a 20 y.o. old male with  1. Epistaxis     Plan:   --Supportive care discussed for nose bleeds.  Apply ointment like Vaseline around nostrils bid and humidifier at home especially if air dry.  Consider restarting zyrtec to help with seasonal allergy control.  If continues to have bleeding contact and will refer to ENT to possible cauterization.      No orders of the defined types were placed in this encounter.    Return if symptoms worsen or fail to improve. in 2-3 days or prior for concerns  Myles Gip, DO

## 2021-05-06 ENCOUNTER — Encounter: Payer: Self-pay | Admitting: Pediatrics

## 2021-06-17 ENCOUNTER — Other Ambulatory Visit: Payer: Self-pay | Admitting: Pediatrics

## 2021-06-18 ENCOUNTER — Telehealth: Payer: Self-pay

## 2021-06-18 NOTE — Telephone Encounter (Signed)
Authorization medication forms placed in Dr. Neville Route basket.

## 2021-06-20 NOTE — Telephone Encounter (Signed)
Medication form filled  

## 2021-09-06 ENCOUNTER — Ambulatory Visit: Payer: Medicaid Other | Admitting: Pediatrics

## 2021-09-11 ENCOUNTER — Ambulatory Visit: Payer: Medicaid Other | Admitting: Pediatrics

## 2021-09-26 ENCOUNTER — Other Ambulatory Visit: Payer: Self-pay

## 2021-09-26 ENCOUNTER — Ambulatory Visit (INDEPENDENT_AMBULATORY_CARE_PROVIDER_SITE_OTHER): Payer: Medicaid Other | Admitting: Pediatrics

## 2021-09-26 ENCOUNTER — Encounter: Payer: Self-pay | Admitting: Pediatrics

## 2021-09-26 DIAGNOSIS — Z23 Encounter for immunization: Secondary | ICD-10-CM | POA: Diagnosis not present

## 2021-09-26 NOTE — Progress Notes (Signed)
Presented today for flu vaccine. No new questions on vaccine. Parent was counseled on risks benefits of vaccine and parent verbalized understanding. Handout (VIS) provided for FLU vaccine. 

## 2021-10-16 ENCOUNTER — Ambulatory Visit (INDEPENDENT_AMBULATORY_CARE_PROVIDER_SITE_OTHER): Payer: Medicaid Other | Admitting: Pediatrics

## 2021-10-16 ENCOUNTER — Other Ambulatory Visit: Payer: Self-pay | Admitting: Pediatrics

## 2021-10-16 ENCOUNTER — Other Ambulatory Visit: Payer: Self-pay

## 2021-10-16 VITALS — BP 112/74 | Ht 65.2 in | Wt 94.2 lb

## 2021-10-16 DIAGNOSIS — G9349 Other encephalopathy: Secondary | ICD-10-CM | POA: Diagnosis not present

## 2021-10-16 DIAGNOSIS — Z0001 Encounter for general adult medical examination with abnormal findings: Secondary | ICD-10-CM

## 2021-10-16 DIAGNOSIS — G801 Spastic diplegic cerebral palsy: Secondary | ICD-10-CM

## 2021-10-16 DIAGNOSIS — Z0000 Encounter for general adult medical examination without abnormal findings: Secondary | ICD-10-CM

## 2021-10-16 DIAGNOSIS — Z68.41 Body mass index (BMI) pediatric, less than 5th percentile for age: Secondary | ICD-10-CM | POA: Diagnosis not present

## 2021-10-16 DIAGNOSIS — Z23 Encounter for immunization: Secondary | ICD-10-CM | POA: Diagnosis not present

## 2021-10-16 DIAGNOSIS — Q079 Congenital malformation of nervous system, unspecified: Secondary | ICD-10-CM

## 2021-10-17 ENCOUNTER — Encounter: Payer: Self-pay | Admitting: Pediatrics

## 2021-10-17 DIAGNOSIS — Z0000 Encounter for general adult medical examination without abnormal findings: Secondary | ICD-10-CM | POA: Insufficient documentation

## 2021-10-17 NOTE — Progress Notes (Signed)
Adolescent Well Care Visit Eric Irwin is a 20 y.o. male who is here for his annual check.    PCP:  Georgiann Hahn, MD   History was provided by the adoptive mother.  Current Issues:  Problems--Spastic CP/ Dev Delay/ Static encephalopathy Needs his Tdap today --has had flu and covid vaccines.  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 Cortical impairment  No neurology follow up anymore  Equipment Weighted spoon/fork--mom buys ---was not approved by insurance Mom makes bibs from towels   PT/OT--trying to get him in for additional PT/OT at school   Nutrition: Good eater-chopped food--not minced--placed into 1/2 inch bite size. Full liquids.   Sleep:  Sleep: good  Social Screening: Lives with:  guardian Parental relations:  good Activities, Work, and Chores: n/a Concerns regarding behavior with peers?  no Stressors of note: medical issues  Education: special ed   Tobacco?  no Secondhand smoke exposure?  no Drugs/ETOH?  no  Sexually Active?  no   Pregnancy Prevention: n/a  Safe at home, in school & in relationships?  Yes Safe to self?  Yes   Screenings: Patient has a dental home: yes  The patient completed the Rapid Assessment for Adolescent Preventive Services screening questionnaire and the following topics were identified as risk factors and discussed: speech/OT/PT ---special needs   PHQ-9 ---not applicable  Physical Exam:  Vitals:   10/16/21 1136  BP: 112/74  Weight: 94 lb 3.2 oz (42.7 kg)  Height: 5' 5.2" (1.656 m)   BP 112/74    Ht 5' 5.2" (1.656 m)    Wt 94 lb 3.2 oz (42.7 kg)    BMI 15.58 kg/m  Body mass index: body mass index is 15.58 kg/m. Growth  percentile SmartLinks can only be used for patients less than 42 years old.    General Appearance:   well nourished  HENT: Normocephalic, no obvious abnormality, conjunctiva clear  Mouth:   Normal appearing teeth, no obvious discoloration, dental caries, or dental caps  Neck:   Supple; thyroid: no enlargement, symmetric, no tenderness/mass/nodules     Lungs:   Clear to auscultation bilaterally, normal work of breathing  Heart:   Regular rate and rhythm, S1 and S2 normal, no murmurs;   Abdomen:   Soft, non-tender, no mass, or organomegaly  GU normal male genitals, no testicular masses or hernia  Musculoskeletal:   Tone and strength strong and symmetrical, all extremities               Lymphatic:   No cervical adenopathy  Skin/Hair/Nails:   Skin warm, dry and intact, no rashes, no bruises or petechiae  Neurologic:   Strength, gait----cerebral palsy      Assessment and Plan:   Stable-Spastic CP/ Dev Delay/ Static encephalopathy  BMI is appropriate for age  Vaccines reviewed --Tdap given  Follow up in 6 months  Georgiann Hahn, MD

## 2021-10-17 NOTE — Patient Instructions (Signed)
Seizure, Adult A seizure is a sudden burst of abnormal electrical and chemical activity in the brain. Seizures usually last from 30 seconds to 2 minutes. The abnormalactivity temporarily interrupts normal brain function. Many types of seizures can affect adults. A seizure can cause many differentsymptoms depending on where in the brain it starts. What are the causes? Common causes of this condition include: Fever or infection. Brain injury, head trauma, bleeding in the brain, or a brain tumor. Low levels of blood sugar or salt (sodium). Kidney problems or liver problems. Metabolic disorders or other conditions that are passed from parent to child (are inherited). Reaction to a substance, such as a drug or a medicine, or suddenly stopping the use of a substance (withdrawal). A stroke. Developmental disorders such as autism spectrum disorder or cerebral palsy. In some cases, the cause of a seizure may not be known. Some people who have a seizure never have another one. A person who has repeated seizures over timewithout a clear cause has a condition called epilepsy. What increases the risk? You are more likely to develop this condition if: You have a family history of epilepsy. You have had a tonic-clonic seizure before. This type of seizure causes tightening (contraction) of the muscles of the whole body and loss of consciousness. You have a history of head trauma, lack of oxygen at birth, or strokes. What are the signs or symptoms? There are many different types of seizures. The symptoms vary depending on the type of seizure you have. Symptoms occur during the seizure. They may also occur before a seizure (aura) and after a seizure (postictal). Symptoms may include the following: Symptoms during a seizure Uncontrollable shaking (convulsions) with fast, jerky movements of muscles. Stiffening of the body. Breathing problems. Confusion, staring, or unresponsiveness. Head nodding, eye blinking  or fluttering, or rapid eye movements. Drooling, grunting, or making clicking sounds with your mouth. Loss of bladder control and bowel control. Symptoms before a seizure Fear or anxiety. Nausea. Vertigo. This is a feeling like: You are moving when you are not. Your surroundings are moving when they are not. Dj vu. This is a feeling of having seen or heard something before. Odd tastes or smells. Changes in vision, such as seeing flashing lights or spots. Symptoms after a seizure Confusion. Sleepiness. Headache. Sore muscles. How is this diagnosed? This condition may be diagnosed based on: A description of your symptoms. Video of your seizures can be helpful. Your medical history. A physical exam. You may also have tests, including: Blood tests. CT scan. MRI. Electroencephalogram (EEG). This test measures electrical activity in the brain. An EEG can predict whether seizures will return. A spinal tap, also called a lumbar puncture. This is the removal and testing of fluid that surrounds the brain and spinal cord. How is this treated? Most seizures will stop on their own in less than 5 minutes, and no treatmentis needed. Seizures that last longer than 5 minutes will usually need treatment. Seizures may be treated with: Medicines given through an IV. Avoiding known triggers, such as medicines that you take for another condition. Medicines to control seizures or prevent future seizures (antiepileptics), if epilepsy caused your seizures. Medical devices to prevent and control seizures. Surgery to stop seizures or to reduce how often seizures happen, if you have epilepsy that does not respond to medicines. A diet low in carbohydrates and high in fat (ketogenic diet). Follow these instructions at home: Medicines Take over-the-counter and prescription medicines only as told by your   health care provider. Avoid any substances that may prevent your medicine from working properly, such  as alcohol. Activity Follow instructions about activities, such as driving or swimming, that would be dangerous if you had another seizure. Wait until your health care provider says it is safe to do them. If you live in the [place] check with your local department of motor vehicles (DMV) to find out about local driving laws. Each state has specific rules about when you can legally drive again. Get enough rest. Lack of sleep can make seizures more likely to occur. Educating others  Teach friends and family what to do if you have a seizure. They should: Help you get down to the ground, to prevent a fall. Cushion your head and move items away from your body. Loosen any tight clothing around your neck. Turn you on your side. If you vomit, this helps keep your airway clear. Know whether or not you need emergency care. Stay with you until you recover. Also, tell them what not to do if you have a seizure. Tell them: They should not hold you down. Holding you down will not stop the seizure. They should not put anything in your mouth.  General instructions Avoid anything that has ever triggered a seizure for you. Keep a seizure diary. Record what you remember about each seizure, especially anything that might have triggered it. Keep all follow-up visits. This is important. Contact a health care provider if: You have another seizure or seizures. Call each time you have a seizure. Your seizure pattern changes. You continue to have seizures with treatment. You have symptoms of an infection or illness. Either of these might increase your risk of having a seizure. You are unable to take your medicine. Get help right away if: You have: A seizure that does not stop after 5 minutes. Several seizures in a row without a complete recovery between seizures. A seizure that makes it harder to breathe. A seizure that leaves you unable to speak or use a part of your body. You do not wake up right away after a  seizure. You injure yourself during a seizure. You have confusion or pain right after a seizure. These symptoms may represent a serious problem that is an emergency. Do not wait to see if the symptoms will go away. Get medical help right away. Call your local emergency services (911 in the [place] Do not drive yourself to the hospital. Summary Seizures are caused by abnormal electrical and chemical activity in the brain. The activity disrupts normal brain function and can cause various symptoms. Seizures have many causes, including illness, head injuries, low levels of blood sugar or salt, and certain conditions. Most seizures will stop on their own in less than 5 minutes. Seizures that last longer than 5 minutes are a medical emergency and need treatment right away. Many medicines are used to treat seizures. Take over-the-counter and prescription medicines only as told by your health care provider. This information is not intended to replace advice given to you by your health care provider. Make sure you discuss any questions you have with your healthcare provider. Document Revised: 04/21/2020 Document Reviewed: 04/21/2020 Elsevier Patient Education  2022 Elsevier Inc.  

## 2021-11-03 ENCOUNTER — Other Ambulatory Visit: Payer: Self-pay | Admitting: Pediatrics

## 2021-12-06 ENCOUNTER — Other Ambulatory Visit: Payer: Self-pay | Admitting: Pediatrics

## 2022-01-10 ENCOUNTER — Telehealth: Payer: Self-pay | Admitting: Pediatrics

## 2022-01-10 NOTE — Telephone Encounter (Signed)
Parent called requesting a updated Authorization of Medication Form to be filled out. Parent states that the school's last form had the patient taking 1.5 pills but the patient only takes 1 pill. Parent requests form to be emailed once completed. Placed in Dr. Laurence Aly office, in basket.  ? ? [email or web address] ?

## 2022-01-10 NOTE — Telephone Encounter (Signed)
Open in error

## 2022-01-14 NOTE — Telephone Encounter (Signed)
Emailed

## 2022-01-14 NOTE — Telephone Encounter (Signed)
Medication form filled  

## 2022-02-21 ENCOUNTER — Other Ambulatory Visit: Payer: Self-pay | Admitting: Pediatrics

## 2022-06-10 ENCOUNTER — Encounter: Payer: Self-pay | Admitting: Pediatrics

## 2022-06-14 ENCOUNTER — Telehealth: Payer: Self-pay | Admitting: Pediatrics

## 2022-06-14 NOTE — Telephone Encounter (Signed)
Mother dropped off authorization of medication school form. Placed in Dr. Laurence Aly office in basket.  Mother requests to be called once form has been completed.  [phone]

## 2022-06-15 ENCOUNTER — Other Ambulatory Visit: Payer: Self-pay | Admitting: Pediatrics

## 2022-06-16 NOTE — Telephone Encounter (Signed)
Child medical report filled  

## 2022-08-30 ENCOUNTER — Telehealth: Payer: Self-pay | Admitting: Pediatrics

## 2022-08-30 NOTE — Telephone Encounter (Signed)
Mother dropped off physical form to be completed. Placed in Dr. Enid Derry office in basket.  Mother requests to be called once form has been completed.  [phone]

## 2022-09-03 NOTE — Telephone Encounter (Signed)
Child medical report filledChild medical report filled

## 2022-12-03 ENCOUNTER — Ambulatory Visit (INDEPENDENT_AMBULATORY_CARE_PROVIDER_SITE_OTHER): Payer: Medicaid Other | Admitting: Pediatrics

## 2022-12-03 VITALS — Wt 98.3 lb

## 2022-12-03 DIAGNOSIS — H1033 Unspecified acute conjunctivitis, bilateral: Secondary | ICD-10-CM | POA: Diagnosis not present

## 2022-12-03 MED ORDER — ERYTHROMYCIN 5 MG/GM OP OINT: 1.0000 | TOPICAL_OINTMENT | Freq: Two times a day (BID) | OPHTHALMIC | 0 refills | Status: AC

## 2022-12-03 NOTE — Patient Instructions (Signed)
Erythromycin ointment- apply to the eyelash line 2 times a day for 7 days Wash hands before and after applying the ointment Follow up as needed  At [facility] we value your feedback. You may receive a survey about your visit today. Please share your experience as we strive to create trusting relationships with our patients to provide genuine, compassionate, quality care.

## 2022-12-04 ENCOUNTER — Encounter: Payer: Self-pay | Admitting: Pediatrics

## 2022-12-04 DIAGNOSIS — H1033 Unspecified acute conjunctivitis, bilateral: Secondary | ICD-10-CM | POA: Insufficient documentation

## 2022-12-04 NOTE — Progress Notes (Signed)
Subjective:  History provided by father.  Eric Irwin is a 22 y.o. male who presents for evaluation of discharge, erythema, and itching in both eyes. He has noticed the above symptoms for 1 day. Onset was sudden. Patient denies blurred vision, foreign body sensation, pain, photophobia, tearing, and visual field deficit. There is a history of  none .  The following portions of the patient's history were reviewed and updated as appropriate: allergies, current medications, past family history, past medical history, past social history, past surgical history, and problem list.  Review of Systems Pertinent items are noted in HPI.   Objective:    Wt 98 lb 4.8 oz (44.6 kg)   BMI 16.26 kg/m       General: alert, cooperative, appears stated age, and no distress  Eyes:  positive findings: conjunctiva: 1+ injection and sclera erythematous  Vision: Not performed  Fluorescein:  not done     Assessment:    Acute conjunctivitis   Plan:    Discussed the diagnosis and proper care of conjunctivitis.  Stressed household Nurse, mental health. Ophthalmic ointment per orders. Warm compress to eye(s). Local eye care discussed. Analgesics as needed. FU here in 3 days or PRN.

## 2022-12-21 ENCOUNTER — Other Ambulatory Visit: Payer: Self-pay | Admitting: Pediatrics

## 2023-04-01 ENCOUNTER — Other Ambulatory Visit: Payer: Self-pay | Admitting: Pediatrics

## 2023-06-11 ENCOUNTER — Ambulatory Visit (INDEPENDENT_AMBULATORY_CARE_PROVIDER_SITE_OTHER): Payer: MEDICAID | Admitting: Pediatrics

## 2023-06-11 VITALS — BP 110/70 | Ht 65.3 in | Wt 96.0 lb

## 2023-06-11 DIAGNOSIS — G801 Spastic diplegic cerebral palsy: Secondary | ICD-10-CM | POA: Diagnosis not present

## 2023-06-11 DIAGNOSIS — Z0000 Encounter for general adult medical examination without abnormal findings: Secondary | ICD-10-CM

## 2023-06-11 DIAGNOSIS — Q079 Congenital malformation of nervous system, unspecified: Secondary | ICD-10-CM | POA: Diagnosis not present

## 2023-06-11 DIAGNOSIS — Z0001 Encounter for general adult medical examination with abnormal findings: Secondary | ICD-10-CM | POA: Diagnosis not present

## 2023-06-11 DIAGNOSIS — Z68.41 Body mass index (BMI) pediatric, less than 5th percentile for age: Secondary | ICD-10-CM

## 2023-06-11 DIAGNOSIS — G9349 Other encephalopathy: Secondary | ICD-10-CM | POA: Diagnosis not present

## 2023-06-11 MED ORDER — POLYETHYLENE GLYCOL 3350 17 G PO PACK: 17.0000 g | PACK | Freq: Every day | ORAL | 4 refills | Status: AC

## 2023-06-11 MED ORDER — GLYCOPYRROLATE 1 MG PO TABS: 1.0000 mg | ORAL_TABLET | Freq: Every day | ORAL | 4 refills | Status: DC

## 2023-06-11 MED ORDER — CETIRIZINE HCL 10 MG PO TABS: ORAL_TABLET | ORAL | 4 refills | Status: DC

## 2023-06-11 NOTE — Patient Instructions (Signed)

## 2023-06-14 ENCOUNTER — Encounter: Payer: Self-pay | Admitting: Pediatrics

## 2023-06-14 DIAGNOSIS — G9349 Other encephalopathy: Secondary | ICD-10-CM | POA: Insufficient documentation

## 2023-06-14 DIAGNOSIS — Z68.41 Body mass index (BMI) pediatric, less than 5th percentile for age: Secondary | ICD-10-CM | POA: Insufficient documentation

## 2023-06-14 DIAGNOSIS — Z0000 Encounter for general adult medical examination without abnormal findings: Secondary | ICD-10-CM | POA: Insufficient documentation

## 2023-06-14 DIAGNOSIS — G801 Spastic diplegic cerebral palsy: Secondary | ICD-10-CM | POA: Insufficient documentation

## 2023-06-14 DIAGNOSIS — Q079 Congenital malformation of nervous system, unspecified: Secondary | ICD-10-CM | POA: Insufficient documentation

## 2023-06-14 NOTE — Progress Notes (Signed)
Adolescent Well Care Visit Eric Irwin is a 22 y.o. male who is here for his annual check.    PCP:  Georgiann Hahn, MD   History was provided by the adoptive mother.  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 Cortical impairment No neurology follow up anymore Equipment Weighted spoon/fork--mom buys ---was not approved by insurance Mom makes bibs from towels PT/OT--trying to get him in for additional PT/OT at school   Nutrition: Good eater-chopped food--not minced--placed into 1/2 inch bite size. Full liquids.   Sleep:  Sleep: good  Social Screening: Lives with:  guardian Parental relations:  good Activities, Work, and Chores: n/a Concerns regarding behavior with peers?  no Stressors of note: medical issues  Education: special ed   Tobacco?  no Secondhand smoke exposure?  no Drugs/ETOH?  no  Sexually Active?  no   Pregnancy Prevention: n/a  Safe at home, in school & in relationships?  Yes Safe to self?  Yes   Screenings: Patient has a dental home: yes  The patient completed the Rapid Assessment for Adolescent Preventive Services screening questionnaire and the following topics were identified as risk factors and discussed: speech/OT/PT ---special needs   PHQ-9 ---not applicable  Physical Exam:  Vitals:   06/14/23 2337  BP: 110/70  Weight: 96 lb (43.5 kg)  Height: 5' 5.3" (1.659 m)   BP 110/70   Ht 5' 5.3" (1.659 m)   Wt 96 lb (43.5 kg)   BMI 15.83 kg/m  Body mass index: body mass index is 15.83 kg/m.   General Appearance:   well nourished  HENT: Normocephalic, no obvious abnormality,  conjunctiva clear  Mouth:   Normal appearing teeth, no obvious discoloration, dental caries, or dental caps  Neck:   Supple;      Lungs:   Clear to auscultation bilaterally, normal work of breathing  Heart:   Regular rate and rhythm, S1 and S2 normal, no murmurs;   Abdomen:   Soft, non-tender, no mass, or organomegaly  GU no hernia  Musculoskeletal:   Tone and strength strong and symmetrical, all extremities               Lymphatic:   No cervical adenopathy  Skin/Hair/Nails:   Skin warm, dry and intact, no rashes, no bruises or petechiae  Neurologic:   Strength, gait----cerebral palsy      Assessment and Plan:   Stable-Spastic CP/ Dev Delay/ Static encephalopathy  BMI is appropriate for age   Georgiann Hahn, MD

## 2023-10-05 ENCOUNTER — Ambulatory Visit (HOSPITAL_COMMUNITY)
Admission: EM | Admit: 2023-10-05 | Discharge: 2023-10-05 | Disposition: A | Discharge status: Home / Self Care | Payer: MEDICAID | Attending: Internal Medicine | Admitting: Internal Medicine

## 2023-10-05 ENCOUNTER — Encounter (HOSPITAL_COMMUNITY): Payer: Self-pay | Admitting: Emergency Medicine

## 2023-10-05 DIAGNOSIS — J069 Acute upper respiratory infection, unspecified: Secondary | ICD-10-CM | POA: Insufficient documentation

## 2023-10-05 DIAGNOSIS — J029 Acute pharyngitis, unspecified: Secondary | ICD-10-CM | POA: Insufficient documentation

## 2023-10-05 LAB — POCT RAPID STREP A (OFFICE): Rapid Strep A Screen: NEGATIVE

## 2023-10-05 NOTE — ED Provider Notes (Signed)
[facility]    CSN: [id number] Arrival date & time: 10/05/23  1013      History   Chief Complaint Chief Complaint  Patient presents with   Nasal Congestion    HPI Eric Irwin is a 22 y.o. male.   Patient is brought into clinic by mother, reports that he functions about at the level of a 49-month-old due to his cerebral palsy.  Over the past 2 days he has had a lot of nasal congestion, rhinorrhea and loss of appetite.  He is putting food into his mouth but he is not showing it, thinks he may have a sore throat.  He is nonverbal.  Other people in the house are sick with similar symptoms.  Mother is concerned that he has strep throat because he normally does not show any symptoms.  He has not had any fevers.  Mother did give him an allergy medicine yesterday to see if this help with his congestion, it did not.  The history is provided by the patient, medical records and a caregiver. The history is limited by a developmental delay.    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 Authorizing Provider  cetirizine (ZYRTEC) 10 MG tablet TAKE 1 TABLET(10 MG) BY MOUTH DAILY 06/11/23 09/10/23  Georgiann Hahn, MD  diazepam (VALIUM) 5 MG tablet  10/11/14   [provider]  diazePAM 5 MG/5ML SOLN SMARTSIG:10 Milliliter(s) By Mouth 09/24/22   [provider]  glycopyrrolate (ROBINUL) 1 MG tablet Take 1 tablet (1 mg total) by mouth daily. 06/11/23 09/09/23  Georgiann Hahn, MD    Family History Family History  Problem Relation Age of Onset   Diabetes Maternal Grandfather    Heart disease Paternal Grandmother    Hypertension Paternal Grandmother    Birth defects Neg Hx    Asthma Neg Hx    Arthritis Neg Hx    Cancer Neg Hx    COPD Neg Hx    Depression Neg Hx    Drug abuse Neg Hx    Early death Neg Hx    Hearing loss Neg Hx    Kidney disease  Neg Hx    Hyperlipidemia Neg Hx    Learning disabilities Neg Hx    Mental illness Neg Hx    Mental retardation Neg Hx    Miscarriages / Stillbirths Neg Hx    Stroke Neg Hx    Vision loss Neg Hx     Social History Social History   Tobacco Use   Smoking status: Never   Smokeless tobacco: Never  Vaping Use   Vaping status: Never Used  Substance Use Topics   Alcohol use: Never   Drug use: Never     Allergies   Patient has no known allergies.   Review of Systems Review of Systems  Per HPI   Physical Exam Triage Vital Signs ED Triage Vitals [10/05/23 1053]  Encounter Vitals Group     BP 112/76     Systolic BP Percentile      Diastolic BP Percentile      Pulse Rate 85     Resp 17      Temp 97.8 F (36.6 C)     Temp Source Oral     SpO2 97 %     Weight      Height      Head Circumference      Peak Flow      Pain Score      Pain Loc      Pain Education      Exclude from Growth Chart    No data found.  Updated Vital Signs BP 112/76 (BP Location: Left Arm)   Pulse 85   Temp 97.8 F (36.6 C) (Oral)   Resp 17   SpO2 97%   Visual Acuity Right Eye Distance:   Left Eye Distance:   Bilateral Distance:    Right Eye Near:   Left Eye Near:    Bilateral Near:     Physical Exam Vitals and nursing note reviewed.  Constitutional:      Appearance: Normal appearance.  HENT:     Head: Normocephalic and atraumatic.     Right Ear: External ear normal.     Left Ear: External ear normal.     Nose: Congestion and rhinorrhea present.     Mouth/Throat:     Mouth: Mucous membranes are moist.  Cardiovascular:     Rate and Rhythm: Normal rate and regular rhythm.     Heart sounds: Normal heart sounds. No murmur heard. Pulmonary:     Effort: Pulmonary effort is normal. No respiratory distress.     Breath sounds: Normal breath sounds.  Skin:    General: Skin is warm and dry.  Neurological:     Mental Status: He is alert. Mental status is at baseline.  Psychiatric:        Mood and Affect: Mood normal.      UC Treatments / Results  Labs (all labs ordered are listed, but only abnormal results are displayed) Labs Reviewed  CULTURE, GROUP A STREP [facility])  POCT RAPID STREP A (OFFICE)    EKG   Radiology No results found.  Procedures Procedures (including critical care time)  Medications Ordered in UC Medications - No data to display  Initial Impression / Assessment and Plan / UC Course  I have reviewed the triage vital signs and the nursing notes.  Pertinent labs & imaging results that were available during my care of the patient were reviewed by me and considered in my medical decision making (see chart for details).  Vitals and triage reviewed, patient  is hemodynamically stable.  Excess nasal congestion and rhinorrhea present on physical exam.  Unable to visualize posterior pharynx due to patient cooperation.  Lungs are vesicular, heart with regular rate and rhythm.  Rapid strep is negative, will send for culture to ensure no antibiotics are indicated.  Suspect viral URI, symptomatic management reviewed.  Mother verbalized understanding, no questions at this time.     Final Clinical Impressions(s) / UC Diagnoses   Final diagnoses:  Viral URI  Acute pharyngitis, unspecified etiology     Discharge Instructions      His rapid strep test was negative, we are sending this off for culture and we will contact you if antibiotics are indicated.  In the meantime ensure he is staying well-hydrated.  He can eat soft foods like mashed potatoes and popsicles to help with his sore throat.  Please follow-up with his primary care provider if no improvement over the next 5 to 7 days.    ED Prescriptions   None    PDMP not reviewed this encounter.   Gadiel John, [place], [place] 10/05/23 1125

## 2023-10-05 NOTE — ED Triage Notes (Signed)
Pt presents with mother who states he is nonverbal and funtion at 22yr old level. States he has had a loss of appetite, and drainage for two days. She thinks he might have strep throat.

## 2023-10-05 NOTE — Discharge Instructions (Addendum)
His rapid strep test was negative, we are sending this off for culture and we will contact you if antibiotics are indicated.  In the meantime ensure he is staying well-hydrated.  He can eat soft foods like mashed potatoes and popsicles to help with his sore throat.  Please follow-up with his primary care provider if no improvement over the next 5 to 7 days.

## 2023-10-07 LAB — CULTURE, GROUP A STREP (THRC)

## 2024-07-12 ENCOUNTER — Encounter: Payer: Self-pay | Admitting: Pediatrics

## 2024-07-12 ENCOUNTER — Ambulatory Visit (INDEPENDENT_AMBULATORY_CARE_PROVIDER_SITE_OTHER): Payer: MEDICAID | Admitting: Pediatrics

## 2024-07-12 VITALS — BP 104/70 | Ht 66.5 in | Wt 92.0 lb

## 2024-07-12 DIAGNOSIS — G801 Spastic diplegic cerebral palsy: Secondary | ICD-10-CM

## 2024-07-12 DIAGNOSIS — Z23 Encounter for immunization: Secondary | ICD-10-CM | POA: Diagnosis not present

## 2024-07-12 DIAGNOSIS — Z68.41 Body mass index (BMI) pediatric, less than 5th percentile for age: Secondary | ICD-10-CM

## 2024-07-12 DIAGNOSIS — Z0000 Encounter for general adult medical examination without abnormal findings: Secondary | ICD-10-CM

## 2024-07-12 DIAGNOSIS — Q079 Congenital malformation of nervous system, unspecified: Secondary | ICD-10-CM | POA: Diagnosis not present

## 2024-07-12 DIAGNOSIS — G9349 Other encephalopathy: Secondary | ICD-10-CM

## 2024-07-12 DIAGNOSIS — Z0001 Encounter for general adult medical examination with abnormal findings: Secondary | ICD-10-CM

## 2024-07-12 MED ORDER — CETIRIZINE HCL 10 MG PO TABS: ORAL_TABLET | ORAL | 4 refills | Status: AC

## 2024-07-12 MED ORDER — POLYETHYLENE GLYCOL 3350 17 G PO PACK: 17.0000 g | PACK | Freq: Every day | ORAL | 3 refills | Status: AC

## 2024-07-12 MED ORDER — GLYCOPYRROLATE 1 MG PO TABS: 1.0000 mg | ORAL_TABLET | Freq: Every day | ORAL | 4 refills | Status: AC

## 2024-07-12 NOTE — Progress Notes (Signed)
 No concerns   No equipment   Diapers and bedpads --gloves and mask  Adolescent Well Care Visit Eric Irwin is a 23 y.o. male who is here for his annual check.    PCP:  Raymar Joiner, MD   History was provided by the adoptive mother.  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 Cortical impairment No neurology follow up anymore Equipment Weighted spoon/fork--mom buys ---was not approved by insurance Mom makes bibs from towels   Nutrition: Good eater-chopped food--not minced--placed into 1/2 inch bite size. Full liquids.   Sleep:  Sleep: good  Social Screening: Lives with:  guardian Parental relations:  good Activities, Work, and Chores: n/a Concerns regarding behavior with peers?  no Stressors of note: medical issues  Education: special ed   Tobacco?  no Secondhand smoke exposure?  no Drugs/ETOH?  no  Sexually Active?  no   Pregnancy Prevention: n/a  Safe at home, in school & in relationships?  Yes Safe to self?  Yes   Screenings: Patient has a dental home: yes    PHQ-9 ---not applicable  Physical Exam:  Vitals:   07/12/24 0947  BP: 104/70  Weight: 92 lb (41.7 kg)  Height: 5' 6.5 (1.689 m)   BP 104/70   Ht 5' 6.5 (1.689 m)   Wt 92 lb (41.7 kg)   BMI 14.63 kg/m  Body mass index: body mass index is 14.63 kg/m.   General Appearance:   well nourished  HENT: Normocephalic, no obvious abnormality, conjunctiva clear  Mouth:   Normal appearing teeth, no obvious discoloration, dental caries, or dental caps  Neck:   Supple;      Lungs:   Clear to auscultation bilaterally, normal work of breathing   Heart:   Regular rate and rhythm, S1 and S2 normal, no murmurs;   Abdomen:   Soft, non-tender, no mass, or organomegaly  GU no hernia  Musculoskeletal:   Mild spasticity           Lymphatic:   No cervical adenopathy  Skin/Hair/Nails:   Skin warm, dry and intact, no rashes, no bruises or petechiae  Neurologic:   Baseline-cerebral palsy      Assessment and Plan:   Stable-Spastic CP/ Dev Delay/ Static encephalopathy  BMI is appropriate for age  Orders Placed This Encounter  Procedures   Flu vaccine trivalent PF, 6mos and older(Flulaval,Afluria,Fluarix,Fluzone)     [place], MD
